# Patient Record
Sex: Male | Born: 1995 | Race: White | Hispanic: No | Marital: Married | Smoking: Current some day smoker
Health system: Southern US, Community
[De-identification: ages and names within clinical notes are randomized; demographics above are authoritative.]

## PROBLEM LIST (undated history)

## (undated) DIAGNOSIS — J45909 Unspecified asthma, uncomplicated: Secondary | ICD-10-CM

## (undated) HISTORY — PX: NO PAST SURGERIES: SHX2092

## (undated) HISTORY — DX: Unspecified asthma, uncomplicated: J45.909

---
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
## 2017-02-24 ENCOUNTER — Ambulatory Visit
Admission: EM | Admit: 2017-02-24 | Discharge: 2017-02-24 | Disposition: A | Discharge status: Home / Self Care | Payer: BLUE CROSS/BLUE SHIELD | Attending: Family Medicine | Admitting: Family Medicine

## 2017-02-24 ENCOUNTER — Ambulatory Visit (INDEPENDENT_AMBULATORY_CARE_PROVIDER_SITE_OTHER): Payer: BLUE CROSS/BLUE SHIELD

## 2017-02-24 ENCOUNTER — Other Ambulatory Visit: Payer: Self-pay

## 2017-02-24 DIAGNOSIS — R0789 Other chest pain: Secondary | ICD-10-CM | POA: Diagnosis not present

## 2017-02-24 DIAGNOSIS — R079 Chest pain, unspecified: Secondary | ICD-10-CM | POA: Diagnosis not present

## 2017-02-24 DIAGNOSIS — K219 Gastro-esophageal reflux disease without esophagitis: Secondary | ICD-10-CM | POA: Diagnosis not present

## 2017-02-24 DIAGNOSIS — R0602 Shortness of breath: Secondary | ICD-10-CM

## 2017-02-24 LAB — CBC WITH DIFFERENTIAL/PLATELET
BASOS ABS: 0.1 10*3/uL (ref 0–0.1)
Basophils Relative: 1 %
EOS PCT: 1 %
Eosinophils Absolute: 0.1 10*3/uL (ref 0–0.7)
HEMATOCRIT: 45.7 % (ref 40.0–52.0)
HEMOGLOBIN: 15.6 g/dL (ref 13.0–18.0)
LYMPHS PCT: 36 %
Lymphs Abs: 3.8 10*3/uL — ABNORMAL HIGH (ref 1.0–3.6)
MCH: 30 pg (ref 26.0–34.0)
MCHC: 34.2 g/dL (ref 32.0–36.0)
MCV: 87.9 fL (ref 80.0–100.0)
Monocytes Absolute: 0.6 10*3/uL (ref 0.2–1.0)
Monocytes Relative: 6 %
NEUTROS ABS: 6 10*3/uL (ref 1.4–6.5)
NEUTROS PCT: 56 %
PLATELETS: 264 10*3/uL (ref 150–440)
RBC: 5.2 MIL/uL (ref 4.40–5.90)
RDW: 12.7 % (ref 11.5–14.5)
WBC: 10.7 10*3/uL — AB (ref 3.8–10.6)

## 2017-02-24 LAB — BASIC METABOLIC PANEL
Anion gap: 8 (ref 5–15)
BUN: 17 mg/dL (ref 6–20)
CHLORIDE: 101 mmol/L (ref 101–111)
CO2: 27 mmol/L (ref 22–32)
Calcium: 9.1 mg/dL (ref 8.9–10.3)
Creatinine, Ser: 0.91 mg/dL (ref 0.61–1.24)
GFR calc Af Amer: >60 mL/min (ref >60)
GFR calc non Af Amer: >60 mL/min (ref >60)
GLUCOSE: 97 mg/dL (ref 65–99)
Potassium: 3.8 mmol/L (ref 3.5–5.1)
Sodium: 136 mmol/L (ref 135–145)

## 2017-02-24 NOTE — ED Triage Notes (Signed)
Patient reports chest pain and shortness of breath, some sweating. Patient states that symptoms started yesterday and comes and goes. Patient states that shortness of breath has been constant.

## 2017-02-24 NOTE — ED Provider Notes (Signed)
[facility]    CSN: [id number] Arrival date & time: 02/24/17  1846     History   Chief Complaint Chief Complaint  Patient presents with  . Chest Pain    HPI Clifford Estrada is a 21 y.o. male.   21 yo male with a c/o chest pains and shortness of breath for about one month on and off, however worse since yesterday. States pain is sometimes dull and sometimes sharp lasting about 30 seconds. Denies any arm pain, neck pain, or jaw pain. Denies any fevers, chills, wheezing, injuries, vomiting, cough, leg pain. Denies recent travel, prolonged immobilization or surgeries.    The history is provided by the patient.  Chest Pain    History reviewed. No pertinent past medical history.  There are no active problems to display for this patient.   Past Surgical History:  Procedure Laterality Date  . NO PAST SURGERIES         Home Medications    Prior to Admission medications   Not on File    Family History Family History  Problem Relation Age of Onset  . Hypertension Mother   . Diabetes Mother     Social History Social History   Tobacco Use  . Smoking status: Current Some Day Smoker  . Smokeless tobacco: Never Used  Substance Use Topics  . Alcohol use: Yes    Comment: occasionally  . Drug use: No     Allergies   Patient has no known allergies.   Review of Systems Review of Systems  Cardiovascular: Positive for chest pain.     Physical Exam Triage Vital Signs ED Triage Vitals  Enc Vitals Group     BP 02/24/17 1859 (!) 133/93     Pulse Rate 02/24/17 1859 96     Resp 02/24/17 1859 18     Temp 02/24/17 1859 98 F (36.7 C)     Temp Source 02/24/17 1859 Oral     SpO2 02/24/17 1859 100 %     Weight 02/24/17 1857 (!) 320 lb (145.2 kg)     Height 02/24/17 1857 6\' 2"  (1.88 m)     Head Circumference --      Peak Flow --      Pain Score 02/24/17 1857 3     Pain Loc --      Pain Edu? --      Excl. in GC? --    No data found.  Updated  Vital Signs BP (!) 133/93 (BP Location: Left Arm)   Pulse 96   Temp 98 F (36.7 C) (Oral)   Resp 18   Ht 6\' 2"  (1.88 m)   Wt (!) 320 lb (145.2 kg)   SpO2 100%   BMI 41.09 kg/m   Visual Acuity Right Eye Distance:   Left Eye Distance:   Bilateral Distance:    Right Eye Near:   Left Eye Near:    Bilateral Near:     Physical Exam  Constitutional: He is oriented to person, place, and time. He appears well-developed and well-nourished.  Non-toxic appearance. He does not have a sickly appearance. He does not appear ill. No distress.  HENT:  Head: Normocephalic and atraumatic.  Cardiovascular: Normal rate, regular rhythm, normal heart sounds and intact distal pulses.  No murmur heard. Pulmonary/Chest: Effort normal and breath sounds normal. No respiratory distress. He has no wheezes. He has no rales.  Abdominal: Soft. Bowel sounds are normal. He exhibits no distension and no mass. There  is no tenderness. There is no rebound and no guarding.  Neurological: He is alert and oriented to person, place, and time.  Skin: No rash noted. He is not diaphoretic.  Nursing note and vitals reviewed.    UC Treatments / Results  Labs (all labs ordered are listed, but only abnormal results are displayed) Labs Reviewed  CBC WITH DIFFERENTIAL/PLATELET - Abnormal; Notable for the following components:      Result Value   WBC 10.7 (*)    Lymphs Abs 3.8 (*)    All other components within normal limits  BASIC METABOLIC PANEL    EKG  EKG Interpretation None       Radiology Dg Chest 2 View  Result Date: 02/24/2017 CLINICAL DATA:  21 year old male with 2 days of mid sternal chest pain and shortness of breath. EXAM: CHEST  2 VIEW COMPARISON:  None. FINDINGS: Normal lung volumes. The heart size and mediastinal contours are within normal limits. Visualized tracheal air column is within normal limits. Both lungs are clear. The visualized skeletal structures are unremarkable. Negative visible bowel  gas pattern. IMPRESSION: Negative.  No acute cardiopulmonary abnormality. Electronically Signed   By: Odessa FlemingH  Hall M.D.   On: 02/24/2017 19:54    Procedures ED EKG Date/Time: 02/24/2017 7:43 PM Performed by: Payton Mccallumonty, Santiel Topper, MD Authorized by: Payton Mccallumonty, Huntley Demedeiros, MD   ECG reviewed by ED Physician in the absence of a cardiologist: yes   Previous ECG:    Previous ECG:  Unavailable Interpretation:    Interpretation: normal   Rate:    ECG rate assessment: normal   Rhythm:    Rhythm: sinus rhythm   Ectopy:    Ectopy: none   QRS:    QRS axis:  Normal Conduction:    Conduction: normal   ST segments:    ST segments:  Normal T waves:    T waves: normal      (including critical care time)  Medications Ordered in UC Medications - No data to display   Initial Impression / Assessment and Plan / UC Course  I have reviewed the triage vital signs and the nursing notes.  Pertinent labs & imaging results that were available during my care of the patient were reviewed by me and considered in my medical decision making (see chart for details).       Final Clinical Impressions(s) / UC Diagnoses   Final diagnoses:  Atypical chest pain  Gastroesophageal reflux disease, esophagitis presence not specified    ED Discharge Orders    None     1. Labs/x-ray/ekg results (normal/negative) and diagnosis reviewed with patient 2. Recommend supportive treatment with otc acetaminophen prn, otc PPI prn 4. Establish care with a PCP 5. Follow-up prn if symptoms worsen or don't improve  Controlled Substance Prescriptions  Controlled Substance Registry consulted? Not Applicable   Payton Mccallumonty, Satoria Dunlop, MD 02/24/17 2028

## 2017-08-12 DIAGNOSIS — H6691 Otitis media, unspecified, right ear: Secondary | ICD-10-CM | POA: Diagnosis not present

## 2017-08-12 DIAGNOSIS — J029 Acute pharyngitis, unspecified: Secondary | ICD-10-CM | POA: Diagnosis not present

## 2018-01-20 ENCOUNTER — Ambulatory Visit
Admission: EM | Admit: 2018-01-20 | Discharge: 2018-01-20 | Disposition: A | Discharge status: Home / Self Care | Payer: BLUE CROSS/BLUE SHIELD | Attending: Family Medicine | Admitting: Family Medicine

## 2018-01-20 ENCOUNTER — Other Ambulatory Visit: Payer: Self-pay

## 2018-01-20 ENCOUNTER — Encounter: Payer: Self-pay | Admitting: Emergency Medicine

## 2018-01-20 DIAGNOSIS — L03115 Cellulitis of right lower limb: Secondary | ICD-10-CM | POA: Diagnosis not present

## 2018-01-20 MED ORDER — DOXYCYCLINE HYCLATE 100 MG PO CAPS: 100.0000 mg | ORAL_CAPSULE | Freq: Two times a day (BID) | ORAL | 0 refills | Status: DC

## 2018-01-20 NOTE — Discharge Instructions (Signed)
Antibiotic as prescribed.  Keep a close eye.  Take care  Dr. Adriana Simas

## 2018-01-20 NOTE — ED Provider Notes (Signed)
[facility]    CSN: [id number] Arrival date & time: 01/20/18  0909  History   Chief Complaint Chief Complaint  Patient presents with  . Cellulitis   HPI  22 year old male presents with suspected bite.  Patient states that he noticed redness, warmth, and swelling of his right lower leg yesterday.  He suspects that he has had an insect bite but is unsure.  No recent fall, trauma, injury.  Patient reports subjective fever and chills.  He is afebrile today.  No known exacerbating or relieving factors.  No other reported symptoms.  No other complaints.   PMH, Surgical Hx, Family Hx, Social History reviewed and updated as below.  PMH: Obesity  Past Surgical History:  Procedure Laterality Date  . NO PAST SURGERIES     Family History Family History  Problem Relation Age of Onset  . Hypertension Mother   . Diabetes Mother   . Healthy Father     Social History Social History   Tobacco Use  . Smoking status: Current Some Day Smoker  . Smokeless tobacco: Never Used  Substance Use Topics  . Alcohol use: Yes    Comment: occasionally  . Drug use: No     Allergies   Patient has no known allergies.   Review of Systems Review of Systems  Constitutional: Positive for chills and fever.  Musculoskeletal:       R lower leg redness, warmth, pain, swelling.   Physical Exam Triage Vital Signs ED Triage Vitals  Enc Vitals Group     BP 01/20/18 0923 127/72     Pulse Rate 01/20/18 0923 84     Resp 01/20/18 0923 16     Temp 01/20/18 0923 98.6 F (37 C)     Temp Source 01/20/18 0923 Oral     SpO2 01/20/18 0923 99 %     Weight 01/20/18 0922 (!) 330 lb (149.7 kg)     Height 01/20/18 0922 6\' 2"  (1.88 m)     Head Circumference --      Peak Flow --      Pain Score 01/20/18 0921 6     Pain Loc --      Pain Edu? --      Excl. in GC? --    Updated Vital Signs BP 127/72 (BP Location: Left Arm)   Pulse 84   Temp 98.6 F (37 C) (Oral)   Resp 16   Ht 6\' 2"  (1.88  m)   Wt (!) 149.7 kg   SpO2 99%   BMI 42.37 kg/m   Visual Acuity Right Eye Distance:   Left Eye Distance:   Bilateral Distance:    Right Eye Near:   Left Eye Near:    Bilateral Near:     Physical Exam  Constitutional: He is oriented to person, place, and time. He appears well-developed. No distress.  HENT:  Head: Normocephalic and atraumatic.  Cardiovascular: Normal rate and regular rhythm.  Pulmonary/Chest: Effort normal and breath sounds normal. He has no wheezes. He has no rales.  Neurological: He is alert and oriented to person, place, and time.  Skin:  Right lower extremity with large area of erythema and warmth.  No visible breaks in skin.  Area was outlined with a skin marker.  Psychiatric: He has a normal mood and affect. His behavior is normal.  Nursing note and vitals reviewed.  UC Treatments / Results  Labs (all labs ordered are listed, but only abnormal results are displayed) Labs Reviewed -  No data to display  EKG None  Radiology No results found.  Procedures Procedures (including critical care time)  Medications Ordered in UC Medications - No data to display  Initial Impression / Assessment and Plan / UC Course  I have reviewed the triage vital signs and the nursing notes.  Pertinent labs & imaging results that were available during my care of the patient were reviewed by me and considered in my medical decision making (see chart for details).    22 year old male presents with cellulitis.  Treating with doxycycline. Final Clinical Impressions(s) / UC Diagnoses   Final diagnoses:  Cellulitis of right lower extremity     Discharge Instructions     Antibiotic as prescribed.  Keep a close eye.  Take care  Dr. Adriana Simas    ED Prescriptions    Medication Sig Dispense Auth. Provider   doxycycline (VIBRAMYCIN) 100 MG capsule Take 1 capsule (100 mg total) by mouth 2 (two) times daily. 14 capsule Tommie Sams, DO     Controlled Substance  Prescriptions [place] Controlled Substance Registry consulted? Not Applicable   Tommie Sams, [place] 01/20/18 1610

## 2018-01-20 NOTE — ED Triage Notes (Signed)
Patient in today c/o right lower leg swelling, redness and pain x 2 days. Patient denies injury and is unsure if something (insect) bit him.

## 2018-02-05 ENCOUNTER — Other Ambulatory Visit (HOSPITAL_COMMUNITY)
Admission: RE | Admit: 2018-02-05 | Discharge: 2018-02-05 | Disposition: A | Discharge status: Home / Self Care | Payer: BLUE CROSS/BLUE SHIELD | Source: Ambulatory Visit | Attending: Family Medicine | Admitting: Family Medicine

## 2018-02-05 ENCOUNTER — Encounter: Payer: Self-pay | Admitting: Family Medicine

## 2018-02-05 ENCOUNTER — Telehealth: Payer: Self-pay | Admitting: Emergency Medicine

## 2018-02-05 ENCOUNTER — Ambulatory Visit: Payer: BLUE CROSS/BLUE SHIELD | Admitting: Family Medicine

## 2018-02-05 VITALS — BP 120/80 | HR 77 | Temp 98.4°F | Resp 16 | Ht 71.5 in | Wt 334.4 lb

## 2018-02-05 DIAGNOSIS — Z114 Encounter for screening for human immunodeficiency virus [HIV]: Secondary | ICD-10-CM

## 2018-02-05 DIAGNOSIS — Z6841 Body Mass Index (BMI) 40.0 and over, adult: Secondary | ICD-10-CM

## 2018-02-05 DIAGNOSIS — Z13220 Encounter for screening for lipoid disorders: Secondary | ICD-10-CM | POA: Diagnosis not present

## 2018-02-05 DIAGNOSIS — Z23 Encounter for immunization: Secondary | ICD-10-CM | POA: Diagnosis not present

## 2018-02-05 DIAGNOSIS — Z599 Problem related to housing and economic circumstances, unspecified: Secondary | ICD-10-CM

## 2018-02-05 DIAGNOSIS — Z113 Encounter for screening for infections with a predominantly sexual mode of transmission: Secondary | ICD-10-CM | POA: Insufficient documentation

## 2018-02-05 DIAGNOSIS — Z7689 Persons encountering health services in other specified circumstances: Secondary | ICD-10-CM

## 2018-02-05 DIAGNOSIS — Z598 Other problems related to housing and economic circumstances: Secondary | ICD-10-CM

## 2018-02-05 DIAGNOSIS — N5082 Scrotal pain: Secondary | ICD-10-CM

## 2018-02-05 DIAGNOSIS — B353 Tinea pedis: Secondary | ICD-10-CM | POA: Diagnosis not present

## 2018-02-05 NOTE — Telephone Encounter (Signed)
Copied from CRM [id number]. Topic: General - Other >> Feb 05, 2018 11:37 AM Weikart, Melissa J wrote: Reason for CRM: Per scheduling, the orders for the ultrasound needs to be changed to U/S scrotum with doppler  Thanks 

## 2018-02-05 NOTE — Telephone Encounter (Signed)
Copied from CRM [phone]. Topic: General - Other >> Feb 05, 2018 11:37 AM Jay SchlichterWeikart, Melissa J wrote: Reason for CRM: Per scheduling, the orders for the ultrasound needs to be changed to U/S scrotum with doppler  Thanks

## 2018-02-05 NOTE — Patient Instructions (Addendum)
Please call to schedule your ultrasound test at [phone].  You must call to schedule, they will not call you.  Diabetes Mellitus and Nutrition When you have diabetes (diabetes mellitus), it is very important to have healthy eating habits because your blood sugar (glucose) levels are greatly affected by what you eat and drink. Eating healthy foods in the appropriate amounts, at about the same times every day, can help you:  Control your blood glucose.  Lower your risk of heart disease.  Improve your blood pressure.  Reach or maintain a healthy weight.  Every person with diabetes is different, and each person has different needs for a meal plan. Your health care provider may recommend that you work with a diet and nutrition specialist (dietitian) to make a meal plan that is best for you. Your meal plan may vary depending on factors such as:  The calories you need.  The medicines you take.  Your weight.  Your blood glucose, blood pressure, and cholesterol levels.  Your activity level.  Other health conditions you have, such as heart or kidney disease.  How do carbohydrates affect me? Carbohydrates affect your blood glucose level more than any other type of food. Eating carbohydrates naturally increases the amount of glucose in your blood. Carbohydrate counting is a method for keeping track of how many carbohydrates you eat. Counting carbohydrates is important to keep your blood glucose at a healthy level, especially if you use insulin or take certain oral diabetes medicines. It is important to know how many carbohydrates you can safely have in each meal. This is different for every person. Your dietitian can help you calculate how many carbohydrates you should have at each meal and for snack. Foods that contain carbohydrates include:  Bread, cereal, rice, pasta, and crackers.  Potatoes and corn.  Peas, beans, and lentils.  Milk and yogurt.  Fruit and juice.  Desserts, such  as cakes, cookies, ice cream, and candy.  How does alcohol affect me? Alcohol can cause a sudden decrease in blood glucose (hypoglycemia), especially if you use insulin or take certain oral diabetes medicines. Hypoglycemia can be a life-threatening condition. Symptoms of hypoglycemia (sleepiness, dizziness, and confusion) are similar to symptoms of having too much alcohol. If your health care provider says that alcohol is safe for you, follow these guidelines:  Limit alcohol intake to no more than 1 drink per day for nonpregnant women and 2 drinks per day for men. One drink equals 12 oz of beer, 5 oz of wine, or 1 oz of hard liquor.  Do not drink on an empty stomach.  Keep yourself hydrated with water, diet soda, or unsweetened iced tea.  Keep in mind that regular soda, juice, and other mixers may contain a lot of sugar and must be counted as carbohydrates.  What are tips for following this plan? Reading food labels  Start by checking the serving size on the label. The amount of calories, carbohydrates, fats, and other nutrients listed on the label are based on one serving of the food. Many foods contain more than one serving per package.  Check the total grams (g) of carbohydrates in one serving. You can calculate the number of servings of carbohydrates in one serving by dividing the total carbohydrates by 15. For example, if a food has 30 g of total carbohydrates, it would be equal to 2 servings of carbohydrates.  Check the number of grams (g) of saturated and trans fats in one serving. Choose foods that have  low or no amount of these fats.  Check the number of milligrams (mg) of sodium in one serving. Most people should limit total sodium intake to less than 2,300 mg per day.  Always check the nutrition information of foods labeled as "low-fat" or "nonfat". These foods may be higher in added sugar or refined carbohydrates and should be avoided.  Talk to your dietitian to identify your  daily goals for nutrients listed on the label. Shopping  Avoid buying canned, premade, or processed foods. These foods tend to be high in fat, sodium, and added sugar.  Shop around the outside edge of the grocery store. This includes fresh fruits and vegetables, bulk grains, fresh meats, and fresh dairy. Cooking  Use low-heat cooking methods, such as baking, instead of high-heat cooking methods like deep frying.  Cook using healthy oils, such as olive, canola, or sunflower oil.  Avoid cooking with butter, cream, or high-fat meats. Meal planning  Eat meals and snacks regularly, preferably at the same times every day. Avoid going long periods of time without eating.  Eat foods high in fiber, such as fresh fruits, vegetables, beans, and whole grains. Talk to your dietitian about how many servings of carbohydrates you can eat at each meal.  Eat 4-6 ounces of lean protein each day, such as lean meat, chicken, fish, eggs, or tofu. 1 ounce is equal to 1 ounce of meat, chicken, or fish, 1 egg, or 1/4 cup of tofu.  Eat some foods each day that contain healthy fats, such as avocado, nuts, seeds, and fish. Lifestyle   Check your blood glucose regularly.  Exercise at least 30 minutes 5 or more days each week, or as told by your health care provider.  Take medicines as told by your health care provider.  Do not use any products that contain nicotine or tobacco, such as cigarettes and e-cigarettes. If you need help quitting, ask your health care provider.  Work with a Veterinary surgeon or diabetes educator to identify strategies to manage stress and any emotional and social challenges. What are some questions to ask my health care provider?  Do I need to meet with a diabetes educator?  Do I need to meet with a dietitian?  What number can I call if I have questions?  When are the best times to check my blood glucose? Where to find more information:  American Diabetes Association:  diabetes.org/food-and-fitness/food  Academy of Nutrition and Dietetics: https://www.vargas.com/  [employer] of Diabetes and Digestive and Kidney Diseases (NIH): FindJewelers.cz Summary  A healthy meal plan will help you control your blood glucose and maintain a healthy lifestyle.  Working with a diet and nutrition specialist (dietitian) can help you make a meal plan that is best for you.  Keep in mind that carbohydrates and alcohol have immediate effects on your blood glucose levels. It is important to count carbohydrates and to use alcohol carefully. This information is not intended to replace advice given to you by your health care provider. Make sure you discuss any questions you have with your health care provider. Document Released: 12/06/2004 Document Revised: 04/15/2016 Document Reviewed: 04/15/2016 Elsevier Interactive Patient Education  2018 [employer].   Pie de atleta (Athlete's Foot) El pie de atleta (tinea pedis) es una infeccin por hongos en la piel de los pies. Generalmente se produce en la piel que se encuentra entre los dedos o debajo de ellos. Tambin puede aparecer en la planta de los pies. La infeccin puede transmitirse de [place] persona  a otra (es contagiosa). CUIDADOS EN EL HOGAR  Aplquese o tome los medicamentos de venta libre y los recetados solamente como se lo haya indicado el mdico.  Oceanographer a todas las visitas de control como se lo haya indicado el mdico. Esto es importante.  No se rasque los pies.  Mantenga los pies secos: ? Use calcetines de algodn o lana. Cmbiese los calcetines [employer] o si se mojan. ? Use un tipo de calzado que permita que el aire Blum, como sandalias o zapatillas de lona.  Lvese y squese los pies: ? [employer] o como se lo haya indicado el mdico. ? Despus de [employer] fsica. ? Sin  olvidar la zona [employer] dedos.  Use sandalias cuando tenga que pisar zonas hmedas, como vestuarios y duchas compartidas.  No comparta ninguno de los siguientes objetos: ? Toallas. ? Alicates para las uas. ? Otros objetos de uso personal que tengan contacto con sus pies.  Si tiene diabetes, mantenga bajo control el nivel de Banker. SOLICITE AYUDA SI:  Tiene fiebre.  Aumenta la hinchazn, el dolor, el calor o el enrojecimiento en el pie.  No mejora con el tratamiento.  Los sntomas empeoran.  Aparecen nuevos sntomas. Esta informacin no tiene Theme park manager el consejo del mdico. Asegrese de hacerle al mdico cualquier pregunta que tenga. Document Released: 04/13/2010 Document Revised: 07/03/2015 Document Reviewed: 09/12/2014 Elsevier Interactive Patient Education  2018 [employer].  Fat and Cholesterol Restricted Diet Getting too much fat and cholesterol in your diet may cause health problems. Following this diet helps keep your fat and cholesterol at normal levels. This can keep you from getting sick. What types of fat should I choose?  Choose monosaturated and polyunsaturated fats. These are found in foods such as olive oil, canola oil, flaxseeds, walnuts, almonds, and seeds.  Eat more omega-3 fats. Good choices include salmon, mackerel, sardines, tuna, flaxseed oil, and ground flaxseeds.  Limit saturated fats. These are in animal products such as meats, butter, and cream. They can also be in plant products such as palm oil, palm kernel oil, and coconut oil.  Avoid foods with partially hydrogenated oils in them. These contain trans fats. Examples of foods that have trans fats are stick margarine, some tub margarines, cookies, crackers, and other baked goods. What general guidelines do I need to follow?  Check food labels. Look for the words "trans fat" and "saturated fat."  When preparing a meal: ? Fill half of your plate with vegetables and green  salads. ? Fill one fourth of your plate with whole grains. Look for the word "whole" as the first word in the ingredient list. ? Fill one fourth of your plate with lean protein foods.  Eat more foods that have fiber, like apples, carrots, beans, peas, and barley.  Eat more home-cooked foods. Eat less at restaurants and buffets.  Limit or avoid alcohol.  Limit foods high in starch and sugar.  Limit fried foods.  Cook foods without frying them. Baking, boiling, grilling, and broiling are all great options.  Lose weight if you are overweight. Losing even a small amount of weight can help your overall health. It can also help prevent diseases such as diabetes and heart disease. What foods can I eat? Grains Whole grains, such as whole wheat or whole grain breads, crackers, cereals, and pasta. Unsweetened oatmeal, bulgur, barley, quinoa, or brown rice. Corn or whole wheat flour tortillas. Vegetables Fresh or frozen vegetables (raw, steamed,  roasted, or grilled). Green salads. Fruits All fresh, canned (in natural juice), or frozen fruits. Meat and Other Protein Products Ground beef (85% or leaner), grass-fed beef, or beef trimmed of fat. Skinless chicken or [place]. Ground chicken or [place]. Pork trimmed of fat. All fish and seafood. Eggs. Dried beans, peas, or lentils. Unsalted nuts or seeds. Unsalted canned or dry beans. Dairy Low-fat dairy products, such as skim or 1% milk, 2% or reduced-fat cheeses, low-fat ricotta or cottage cheese, or plain low-fat yogurt. Fats and Oils Tub margarines without trans fats. Light or reduced-fat mayonnaise and salad dressings. Avocado. Olive, canola, sesame, or safflower oils. Natural peanut or almond butter (choose ones without added sugar and oil). The items listed above may not be a complete list of recommended foods or beverages. Contact your dietitian for more options. What foods are not recommended? Grains White bread. White pasta. White rice.  Cornbread. Bagels, pastries, and croissants. Crackers that contain trans fat. Vegetables White potatoes. Corn. Creamed or fried vegetables. Vegetables in a cheese sauce. Fruits Dried fruits. Canned fruit in light or heavy syrup. Fruit juice. Meat and Other Protein Products Fatty cuts of meat. Ribs, chicken wings, bacon, sausage, bologna, salami, chitterlings, fatback, hot dogs, bratwurst, and packaged luncheon meats. Liver and organ meats. Dairy Whole or 2% milk, cream, half-and-half, and cream cheese. Whole milk cheeses. Whole-fat or sweetened yogurt. Full-fat cheeses. Nondairy creamers and whipped toppings. Processed cheese, cheese spreads, or cheese curds. Sweets and Desserts Corn syrup, sugars, honey, and molasses. Candy. Jam and jelly. Syrup. Sweetened cereals. Cookies, pies, cakes, donuts, muffins, and ice cream. Fats and Oils Butter, stick margarine, lard, shortening, ghee, or bacon fat. Coconut, palm kernel, or palm oils. Beverages Alcohol. Sweetened drinks (such as sodas, lemonade, and fruit drinks or punches). The items listed above may not be a complete list of foods and beverages to avoid. Contact your dietitian for more information. This information is not intended to replace advice given to you by your health care provider. Make sure you discuss any questions you have with your health care provider. Document Released: 09/10/2011 Document Revised: 11/16/2015 Document Reviewed: 06/10/2013 Elsevier Interactive Patient Education  [employer].

## 2018-02-05 NOTE — Progress Notes (Signed)
Name: Clifford Estrada   MRN: [id number]    DOB: 21-Dec-1995   Date:02/05/2018       Progress Note  Subjective  Chief Complaint  Chief Complaint  Patient presents with  . Foot Pain    right foot pain x 2 months, sores and cracking. Has been treating with foot powder and sprays. Difficult to walk sometimes.  . Genital Problems    reports sensitivity on the left side. Denies pain    HPI  Pt presents to establish care and for the following concerns:  Foot Pain: 2 months of right foot pain.  He notes an open sore on the LEFT lateral posterior foot.  He has had athlete's foot ongoing for several months, and has tried sprays and foot powders without relief.  The sore/crack is very painful, has bled occasionally.  Toenails are not affected.   Social: Has had a lot of car issues recently; he also notes "all of my paycheck goes to something", and that things are tight financially right now. 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  Obesity: Body mass index is 45.99 kg/m. Weight Management History: Diet: Diet is poor, but some days he does not eat much at all.  Exercise: Walking with his son and dogs 2 times a week for about 40 minutes.  Co-Morbid Conditions: none; 2 or more of these conditions combined with BMI >30 is considered morbid obesity; is this diagnosis appropriate and/or added to patient's problem list? No   Scrotal Sensory issue:  He notes the LEFT side of the scrotum is a lot more sensitive than the right, especially if something taps/hits/rubs against that area.  Has still been able to achieve erection and orgasm, does not notice an issue with intercourse.  No penile discharge or bleeding, no masses. No issues with urination, no scrotal swelling.  There are no active problems to display for this patient.   Past Surgical History:  Procedure Laterality Date  . NO PAST SURGERIES      Family History  Problem Relation Age of Onset  . Hypertension Mother   .  Diabetes Mother   . Healthy Father     Social History   Socioeconomic History  . Marital status: Married    Spouse name: Not on file  . Number of children: Not on file  . Years of education: Not on file  . Highest education level: Not on file  Occupational History  . Not on file  Social Needs  . Financial resource strain: Hard  . Food insecurity:    Worry: Often true    Inability: Sometimes true  . Transportation needs:    Medical: No    Non-medical: Yes  Tobacco Use  . Smoking status: Current Some Day Smoker  . Smokeless tobacco: Never Used  Substance and Sexual Activity  . Alcohol use: Yes    Comment: occasionally  . Drug use: No  . Sexual activity: Not on file  Lifestyle  . Physical activity:    Days per week: 2 days    Minutes per session: 40 min  . Stress: Rather much  Relationships  . Social connections:    Talks on phone: Twice a week    Gets together: Three times a week    Attends religious service: Never    Active member of club or organization: No    Attends meetings of clubs or organizations: Never    Relationship status: Married  . Intimate partner violence:  Fear of current or ex partner: No    Emotionally abused: No    Physically abused: No    Forced sexual activity: No  Other Topics Concern  . Not on file  Social History Narrative  . Not on file    No current outpatient medications on file.  No Known Allergies  I personally reviewed active problem list, medication list, allergies, family history, social history, health maintenance, notes from last encounter, lab results with the patient/caregiver today.   ROS  Constitutional: Negative for fever or weight change.  Respiratory: Negative for cough and shortness of breath.   Cardiovascular: Negative for chest pain or palpitations.  Gastrointestinal: Negative for abdominal pain, no bowel changes.  Musculoskeletal: Negative for gait problem or joint swelling.  Skin: Negative for rash. See  HPI regarding foot Neurological: Negative for dizziness or headache.  No other specific complaints in a complete review of systems (except as listed in HPI above).  Objective  Vitals:   02/05/18 0927  BP: 120/80  Pulse: 77  Resp: 16  Temp: 98.4 F (36.9 C)  TempSrc: Oral  SpO2: 98%  Weight: (!) 334 lb 6.4 oz (151.7 kg)  Height: 5' 11.5" (1.816 m)   Body mass index is 45.99 kg/m.  Physical Exam  Constitutional: Patient appears well-developed and well-nourished. No distress.  HENT: Head: Normocephalic and atraumatic. Eyes: Conjunctivae and EOM are normal. Neck: Normal range of motion. Neck supple. No JVD present. No thyromegaly present.  Cardiovascular: Normal rate, regular rhythm and normal heart sounds.  No murmur heard. No BLE edema. Pulmonary/Chest: Effort normal and breath sounds normal. No respiratory distress. Abdominal: Soft. Bowel sounds are normal, no distension. There is no tenderness. no masses MALE GENITALIA: Normal descended testes bilaterally, no masses palpated, no hernias, no lesions, no discharge. There is some tenderness to the LEFT scrotum, but not particularly to the testicle. Musculoskeletal: Normal range of motion, no joint effusions. No gross deformities Neurological: he is alert and oriented to person, place, and time. No cranial nerve deficit. Coordination, balance, strength, speech and gait are normal.  Skin: Skin is warm and dry. No rash noted. No erythema.  Psychiatric: Patient has a normal mood and affect. behavior is normal. Judgment and thought content normal.  No results found for this or any previous visit (from the past 72 hour(s)).  PHQ2/9: Depression screen PHQ 2/9 02/05/2018  Decreased Interest 0  Down, Depressed, Hopeless 0  PHQ - 2 Score 0   Assessment & Plan  1. Class 3 severe obesity due to excess calories without serious comorbidity with body mass index (BMI) of 45.0 to 49.9 in adult [facility](HCC) - Discussed importance of 150 minutes of  physical activity weekly, eat two servings of fish weekly, eat one serving of tree nuts ( cashews, pistachios, pecans, almonds.Marland Kitchen.) every other day, eat 6 servings of fruit/vegetables daily and drink plenty of water and avoid sweet beverages. - COMPLETE METABOLIC PANEL WITH GFR - TSH - Hemoglobin A1c - Lipid panel  2. Tinea pedis of both feet - If LFT's WNL, will send in Lamisil PO. - COMPLETE METABOLIC PANEL WITH GFR  3. Painful scrotum - Cytology (oral, anal, urethral) ancillary only - US Scrotum; Future - US Scrotum  4. Routine screening for STI (sexually transmitted infection) - Cytology (oral, anal, urethral) ancillary only - HIV Antibody (routine testing w rflx) - RPR - Hepatitis C antibody  5. Lipid screening - Lipid panel  6. Screening for HIV without presence of risk factors - HIV  Antibody (routine testing w rflx)  7. Financial difficulty - Discussed in detail; declines referral to [facility]  8. Encounter to establish care  9. Need for Tdap vaccination - Tdap vaccine greater than or equal to 7yo IM

## 2018-02-06 LAB — CYTOLOGY, (ORAL, ANAL, URETHRAL) ANCILLARY ONLY
CHLAMYDIA, DNA PROBE: NEGATIVE
NEISSERIA GONORRHEA: NEGATIVE

## 2018-02-06 LAB — COMPLETE METABOLIC PANEL WITH GFR
AG RATIO: 1.3 (calc) (ref 1.0–2.5)
ALT: 90 U/L — ABNORMAL HIGH (ref 9–46)
AST: 43 U/L — AB (ref 10–40)
Albumin: 4.3 g/dL (ref 3.6–5.1)
Alkaline phosphatase (APISO): 59 U/L (ref 40–115)
BILIRUBIN TOTAL: 0.6 mg/dL (ref 0.2–1.2)
BUN: 12 mg/dL (ref 7–25)
CHLORIDE: 104 mmol/L (ref 98–110)
CO2: 27 mmol/L (ref 20–32)
Calcium: 9.6 mg/dL (ref 8.6–10.3)
Creat: 0.82 mg/dL (ref 0.60–1.35)
GFR, EST AFRICAN AMERICAN: 147 mL/min/{1.73_m2} (ref >=60)
GFR, EST NON AFRICAN AMERICAN: 126 mL/min/{1.73_m2} (ref >=60)
GLOBULIN: 3.3 g/dL (ref 1.9–3.7)
Glucose, Bld: 90 mg/dL (ref 65–99)
POTASSIUM: 4.2 mmol/L (ref 3.5–5.3)
SODIUM: 140 mmol/L (ref 135–146)
Total Protein: 7.6 g/dL (ref 6.1–8.1)

## 2018-02-06 LAB — LIPID PANEL
Cholesterol: 173 mg/dL (ref <200)
HDL: 42 mg/dL (ref >40)
LDL CHOLESTEROL (CALC): 109 mg/dL — AB
NON-HDL CHOLESTEROL (CALC): 131 mg/dL — AB (ref <130)
Total CHOL/HDL Ratio: 4.1 (calc) (ref <5.0)
Triglycerides: 112 mg/dL (ref <150)

## 2018-02-06 LAB — HEPATITIS C ANTIBODY
HEP C AB: NONREACTIVE
SIGNAL TO CUT-OFF: 0.04 (ref <1.00)

## 2018-02-06 LAB — HEMOGLOBIN A1C
Hgb A1c MFr Bld: 6.2 % of total Hgb — ABNORMAL HIGH (ref <5.7)
Mean Plasma Glucose: 131 (calc)
eAG (mmol/L): 7.3 (calc)

## 2018-02-06 LAB — RPR: RPR Ser Ql: NONREACTIVE

## 2018-02-06 LAB — TSH: TSH: 1.49 mIU/L (ref 0.40–4.50)

## 2018-02-06 LAB — HIV ANTIBODY (ROUTINE TESTING W REFLEX): HIV: NONREACTIVE

## 2018-02-08 ENCOUNTER — Other Ambulatory Visit: Payer: Self-pay | Admitting: Family Medicine

## 2018-02-08 DIAGNOSIS — E669 Obesity, unspecified: Secondary | ICD-10-CM

## 2018-02-08 DIAGNOSIS — E8881 Metabolic syndrome: Secondary | ICD-10-CM | POA: Insufficient documentation

## 2018-02-08 DIAGNOSIS — E7800 Pure hypercholesterolemia, unspecified: Secondary | ICD-10-CM | POA: Insufficient documentation

## 2018-02-08 DIAGNOSIS — R7989 Other specified abnormal findings of blood chemistry: Secondary | ICD-10-CM

## 2018-02-08 DIAGNOSIS — B353 Tinea pedis: Secondary | ICD-10-CM

## 2018-02-08 DIAGNOSIS — R7303 Prediabetes: Secondary | ICD-10-CM | POA: Insufficient documentation

## 2018-02-08 DIAGNOSIS — R945 Abnormal results of liver function studies: Secondary | ICD-10-CM

## 2018-02-08 MED ORDER — METFORMIN HCL ER 750 MG PO TB24: ORAL_TABLET | ORAL | 0 refills | Status: AC

## 2018-02-09 ENCOUNTER — Ambulatory Visit: Payer: BLUE CROSS/BLUE SHIELD

## 2018-02-27 NOTE — Telephone Encounter (Signed)
Erroneous Entry  

## 2018-03-10 ENCOUNTER — Encounter: Payer: BLUE CROSS/BLUE SHIELD | Admitting: Family Medicine

## 2018-03-10 ENCOUNTER — Telehealth: Payer: Self-pay | Admitting: Family Medicine

## 2018-03-10 NOTE — Telephone Encounter (Signed)
Please call patient to come in for liver function studies recheck.

## 2018-03-11 NOTE — Telephone Encounter (Signed)
Left message for patient

## 2018-07-16 ENCOUNTER — Encounter: Payer: Self-pay | Admitting: Family Medicine

## 2019-02-19 IMAGING — CR DG CHEST 2V
2 series · 2 of 2 positions shown · non-contrast
Comparison: None.

CLINICAL DATA: 21-year-old male with 2 days of mid sternal chest
pain and shortness of breath.

EXAM:
CHEST  2 VIEW

[chest pa]
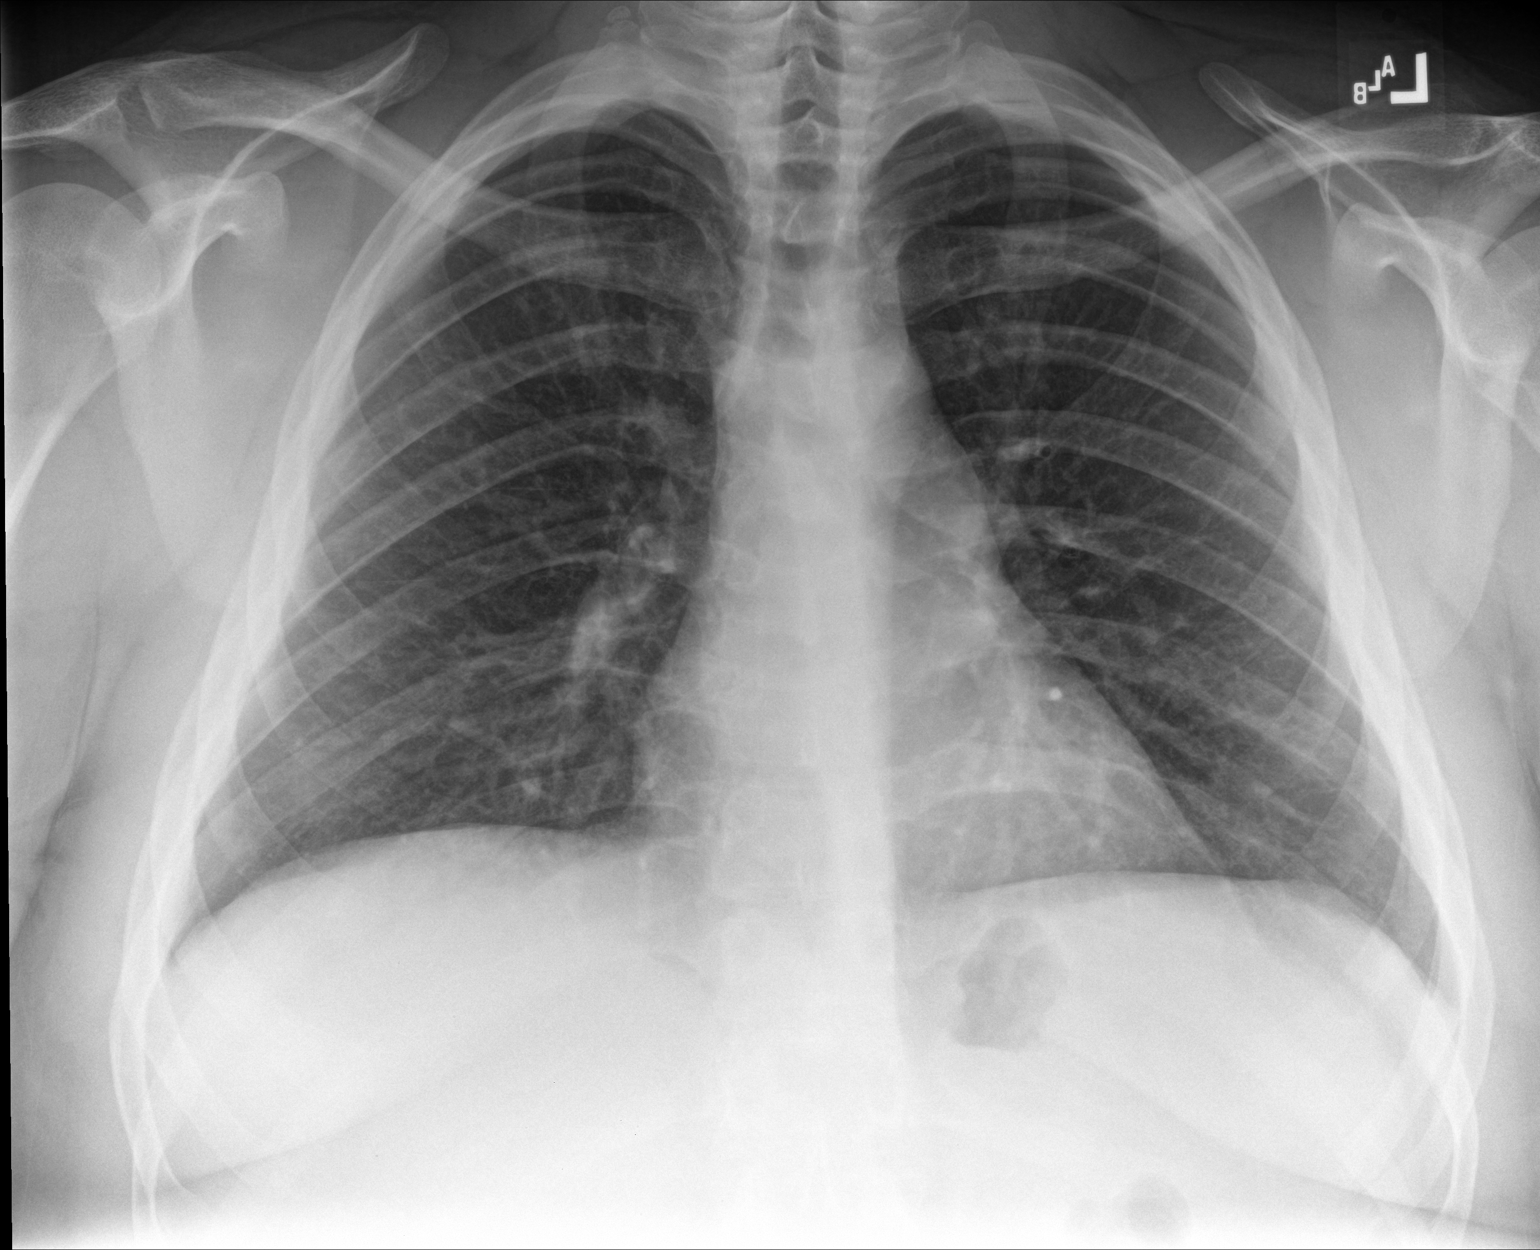

[chest lat]
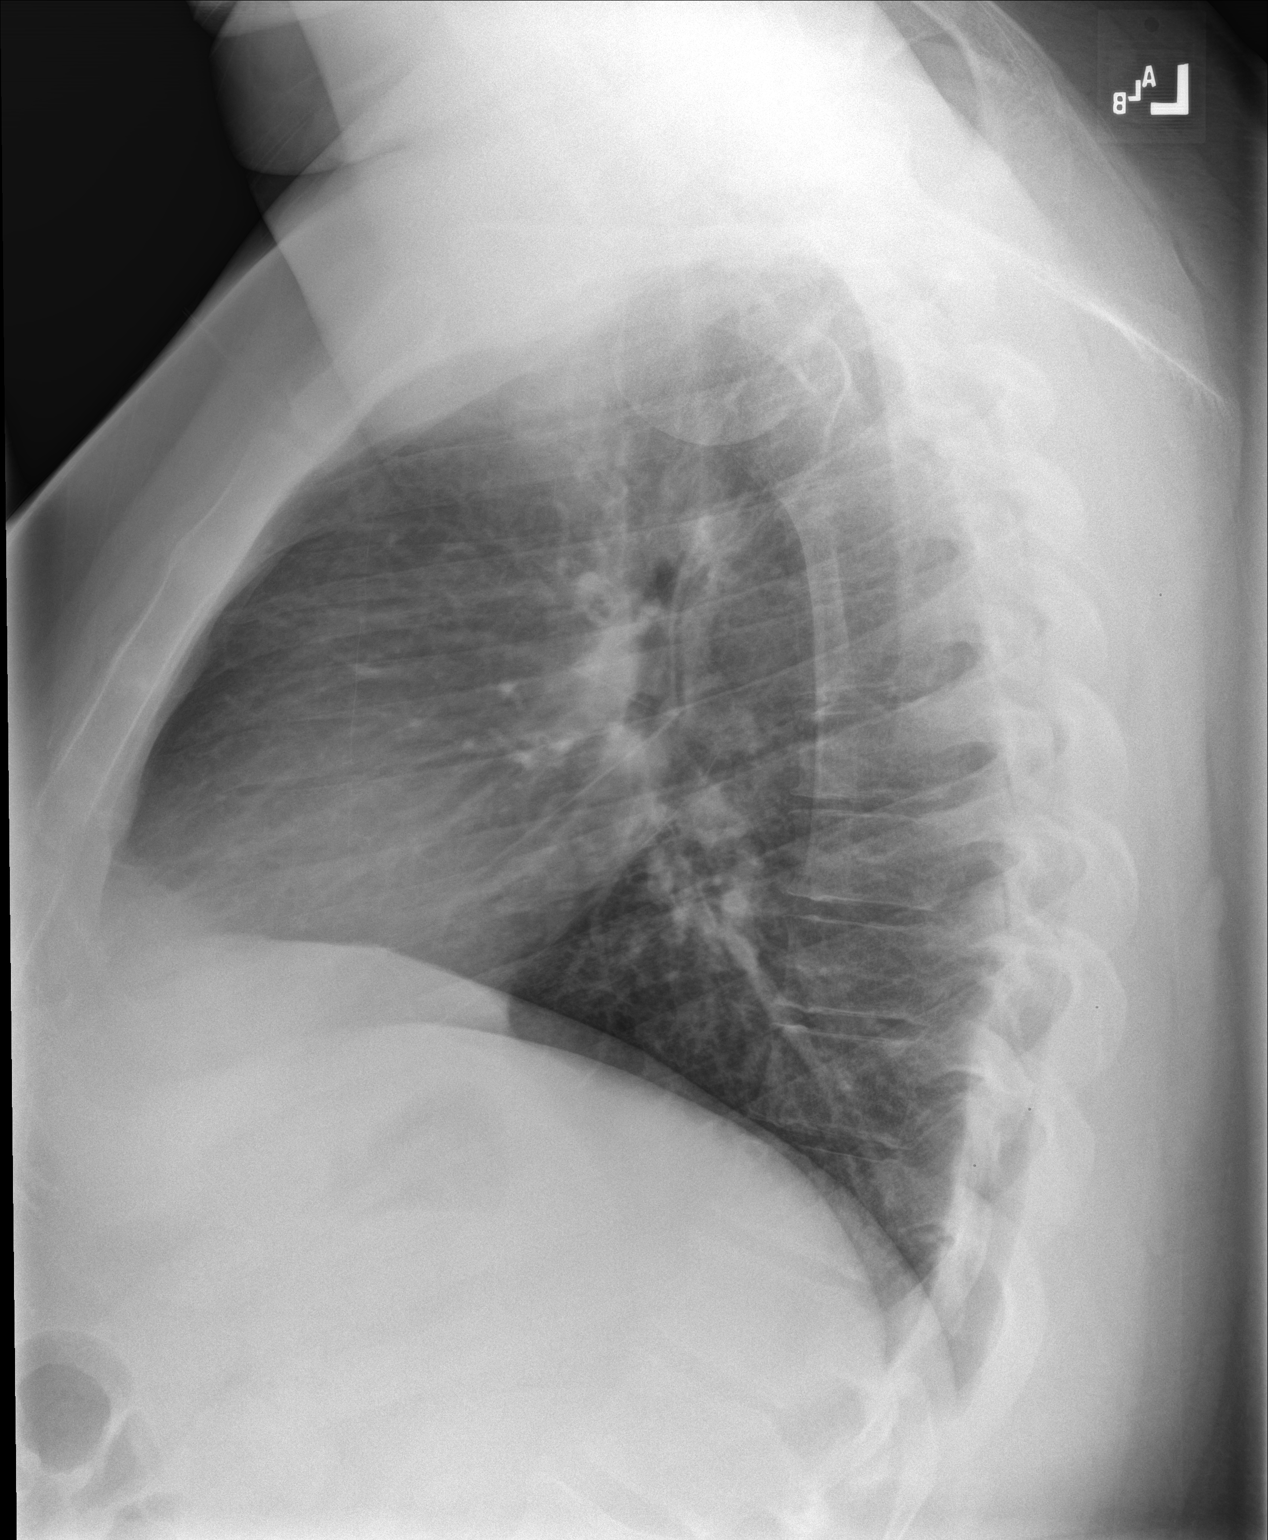

[2 of 2 positions shown; findings below may reference images not displayed]

FINDINGS: Normal lung volumes. The heart size and mediastinal contours are
within normal limits. Visualized tracheal air column is within
normal limits. Both lungs are clear. The visualized skeletal
structures are unremarkable. Negative visible bowel gas pattern.
IMPRESSION: Negative.  No acute cardiopulmonary abnormality.

## 2020-07-24 ENCOUNTER — Ambulatory Visit
Admission: EM | Admit: 2020-07-24 | Discharge: 2020-07-24 | Disposition: A | Discharge status: Home / Self Care | Payer: BC Managed Care – PPO | Attending: Family Medicine | Admitting: Family Medicine

## 2020-07-24 ENCOUNTER — Other Ambulatory Visit: Payer: Self-pay

## 2020-07-24 DIAGNOSIS — Z20822 Contact with and (suspected) exposure to covid-19: Secondary | ICD-10-CM | POA: Insufficient documentation

## 2020-07-24 DIAGNOSIS — R0602 Shortness of breath: Secondary | ICD-10-CM | POA: Insufficient documentation

## 2020-07-24 DIAGNOSIS — R531 Weakness: Secondary | ICD-10-CM | POA: Diagnosis not present

## 2020-07-24 DIAGNOSIS — J039 Acute tonsillitis, unspecified: Secondary | ICD-10-CM | POA: Diagnosis not present

## 2020-07-24 DIAGNOSIS — Z7984 Long term (current) use of oral hypoglycemic drugs: Secondary | ICD-10-CM | POA: Diagnosis not present

## 2020-07-24 DIAGNOSIS — R059 Cough, unspecified: Secondary | ICD-10-CM | POA: Diagnosis present

## 2020-07-24 LAB — GROUP A STREP BY PCR: Group A Strep by PCR: NOT DETECTED

## 2020-07-24 MED ORDER — AMOXICILLIN 500 MG PO TABS: 500.0000 mg | ORAL_TABLET | Freq: Two times a day (BID) | ORAL | 0 refills | Status: AC

## 2020-07-24 NOTE — ED Provider Notes (Signed)
[facility]    CSN: [id number] Arrival date & time: 07/24/20  1536      History   Chief Complaint Chief Complaint  Patient presents with  . Cough   HPI   25 year old male presents with respiratory symptoms.  Patient ports of symptoms started on Friday.  He reports sore throat, cough.  Has had a low-grade temperature, T-max 100.3.  He reports associated shortness of breath and weakness.  He is most bothered by sore throat.  Patient states [place] that [place] may be playing a role.  No relieving factors.  No other associated symptoms.    Past Medical History:  Diagnosis Date  . Childhood asthma     Patient Active Problem List   Diagnosis Date Noted  . Prediabetes 02/08/2018  . Elevated LDL cholesterol level 02/08/2018  . Elevated LFTs 02/08/2018  . Class 3 severe obesity due to excess calories without serious comorbidity with body mass index (BMI) of 45.0 to 49.9 in adult (HCC) 02/05/2018  . Tinea pedis of both feet 02/05/2018    Past Surgical History:  Procedure Laterality Date  . NO PAST SURGERIES         Home Medications    Prior to Admission medications   Medication Sig Start Date End Date Taking? Authorizing Provider  amoxicillin (AMOXIL) 500 MG tablet Take 1 tablet (500 mg total) by mouth 2 (two) times daily. 07/24/20  Yes Nadyne Gariepy G, DO  metFORMIN (GLUCOPHAGE XR) 750 MG 24 hr tablet Take 1 tablet (750 mg total) by mouth daily with breakfast for 7 days, THEN 2 tablets (1,500 mg total) daily with breakfast. 02/08/18 05/09/18  Doren Custard, FNP    Family History Family History  Problem Relation Age of Onset  . Hypertension Mother   . Diabetes Mother   . Healthy Father   . Healthy Sister   . Healthy Maternal Grandmother   . Heart disease Maternal Grandfather   . Healthy Son     Social History Social History   Tobacco Use  . Smoking status: Current Some Day  Smoker    Types: Cigarettes  . Smokeless tobacco: Never Used  . Tobacco comment: Usually w/ Alcohol  Vaping Use  . Vaping Use: Never used  Substance Use Topics  . Alcohol use: Yes    Comment: occasionally  . Drug use: No     Allergies   Patient has no known allergies.   Review of Systems Review of Systems Per HPI  Physical Exam Triage Vital Signs ED Triage Vitals  Enc Vitals Group     BP 07/24/20 1549 (!) 130/95     Pulse Rate 07/24/20 1549 (!) 107     Resp 07/24/20 1549 16     Temp 07/24/20 1549 99.9 F (37.7 C)     Temp Source 07/24/20 1549 Oral     SpO2 07/24/20 1549 98 %     Weight 07/24/20 1548 (!) 330 lb (149.7 kg)     Height 07/24/20 1548 5' 11.5" (1.816 m)     Head Circumference --      Peak Flow --      Pain Score 07/24/20 1548 0     Pain Loc --      Pain Edu? --      Excl. in GC? --    Updated Vital Signs BP (!) 130/95 (BP Location: Left Arm)   Pulse Marland Kitchen)  107   Temp 99.9 F (37.7 C) (Oral)   Resp 16   Ht 5' 11.5" (1.816 m)   Wt (!) 149.7 kg   SpO2 98%   BMI 45.38 kg/m   Visual Acuity Right Eye Distance:   Left Eye Distance:   Bilateral Distance:    Right Eye Near:   Left Eye Near:    Bilateral Near:     Physical Exam Constitutional:      General: He is not in acute distress.    Appearance: Normal appearance. He is obese. He is not ill-appearing.  HENT:     Head: Normocephalic and atraumatic.     Mouth/Throat:     Comments: Enlarged tonsils with erythema and exudate bilaterally. Cardiovascular:     Rate and Rhythm: Normal rate and regular rhythm.  Pulmonary:     Effort: Pulmonary effort is normal.     Breath sounds: Normal breath sounds. No wheezing, rhonchi or rales.  Neurological:     Mental Status: He is alert.  Psychiatric:        Mood and Affect: Mood normal.        Behavior: Behavior normal.    UC Treatments / Results  Labs (all labs ordered are listed, but only abnormal results are displayed) Labs Reviewed  GROUP A  STREP BY PCR  SARS CORONAVIRUS 2 (TAT 6-24 HRS)    EKG   Radiology No results found.  Procedures Procedures (including critical care time)  Medications Ordered in UC Medications - No data to display  Initial Impression / Assessment and Plan / UC Course  I have reviewed the triage vital signs and the nursing notes.  Pertinent labs & imaging results that were available during my care of the patient were reviewed by me and considered in my medical decision making (see chart for details).    25 year old male presents with exudative tonsillitis.  Strep PCR was negative.  I am concerned about possible strep despite negative PCR testing given exam findings.  Also concerned about atypical pathogens.  Placing on amoxicillin.  Final Clinical Impressions(s) / UC Diagnoses   Final diagnoses:  Exudative tonsillitis     Discharge Instructions     Medication as prescribed.  Take care   Dr. Adriana Simas    ED Prescriptions    Medication Sig Dispense Auth. Provider   amoxicillin (AMOXIL) 500 MG tablet Take 1 tablet (500 mg total) by mouth 2 (two) times daily. 20 tablet Tommie Sams, DO     PDMP not reviewed this encounter.   Tommie Sams, [place] 07/24/20 1656

## 2020-07-24 NOTE — Discharge Instructions (Addendum)
Medication as prescribed.  Take care  Dr. Edeline Greening  
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
## 2020-07-25 LAB — SARS CORONAVIRUS 2 (TAT 6-24 HRS): SARS Coronavirus 2: NEGATIVE
